# Patient Record
Sex: Male | Born: 2005 | Race: White | Hispanic: Yes | Marital: Single | Smoking: Never smoker
Health system: Southern US, Community
[De-identification: ages and names within clinical notes are randomized; demographics above are authoritative.]

## PROBLEM LIST (undated history)

## (undated) DIAGNOSIS — F840 Autistic disorder: Secondary | ICD-10-CM

## (undated) HISTORY — PX: SURGERY SCROTAL / TESTICULAR: SUR1316

---
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
## 2023-03-20 ENCOUNTER — Encounter: Payer: Self-pay | Admitting: Pediatrics

## 2023-03-20 ENCOUNTER — Ambulatory Visit (INDEPENDENT_AMBULATORY_CARE_PROVIDER_SITE_OTHER): Payer: Medicaid Other | Admitting: Pediatrics

## 2023-03-20 VITALS — BP 130/84 | Ht 64.41 in | Wt 221.2 lb

## 2023-03-20 DIAGNOSIS — Z114 Encounter for screening for human immunodeficiency virus [HIV]: Secondary | ICD-10-CM

## 2023-03-20 DIAGNOSIS — Z68.41 Body mass index (BMI) pediatric, greater than or equal to 95th percentile for age: Secondary | ICD-10-CM

## 2023-03-20 DIAGNOSIS — J4520 Mild intermittent asthma, uncomplicated: Secondary | ICD-10-CM

## 2023-03-20 DIAGNOSIS — N059 Unspecified nephritic syndrome with unspecified morphologic changes: Secondary | ICD-10-CM

## 2023-03-20 DIAGNOSIS — Z0001 Encounter for general adult medical examination with abnormal findings: Secondary | ICD-10-CM | POA: Diagnosis not present

## 2023-03-20 DIAGNOSIS — F801 Expressive language disorder: Secondary | ICD-10-CM

## 2023-03-20 DIAGNOSIS — J309 Allergic rhinitis, unspecified: Secondary | ICD-10-CM

## 2023-03-20 DIAGNOSIS — Z113 Encounter for screening for infections with a predominantly sexual mode of transmission: Secondary | ICD-10-CM

## 2023-03-20 MED ORDER — LISINOPRIL 10 MG PO TABS: 10.0000 mg | ORAL_TABLET | Freq: Every day | ORAL | 2 refills | Status: DC

## 2023-03-20 MED ORDER — LORATADINE 10 MG PO TBDP: 10.0000 mg | ORAL_TABLET | Freq: Every day | ORAL | 12 refills | Status: AC

## 2023-03-20 MED ORDER — ALBUTEROL SULFATE HFA 108 (90 BASE) MCG/ACT IN AERS: 2.0000 | INHALATION_SPRAY | RESPIRATORY_TRACT | 0 refills | Status: AC | PRN

## 2023-03-20 MED ORDER — FLUTICASONE PROPIONATE 50 MCG/ACT NA SUSP: 1.0000 | Freq: Two times a day (BID) | NASAL | 12 refills | Status: DC

## 2023-03-20 NOTE — Progress Notes (Signed)
Adolescent Well Care Visit Robert Goodman is a 17 y.o. male who is here for well care.     PCP:  Jonetta Osgood, MD   History was provided by the patient and mother.  Confidentiality was discussed with the patient and, if applicable, with caregiver as well. Patient's personal or confidential phone number:    Current Issues: Current concerns include .   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  Has guardianship in place  Nutrition: Nutrition/Eating Behaviors: no concerns Adequate calcium in diet?: yes Supplements/ Vitamins: none  Exercise/ Media: Play any Sports?:  none Exercise:  not active Screen Time:  < 2 hours Media Rules or Monitoring?: yes  Sleep:  Sleep: adequate - no loud snoring  Social Screening: Lives with:  parents, siblings Parental relations:  good  Education: School Name: [employer] - self-contained classroom  School Grade: [employer]  Patient has a dental home: yes   Confidential social history: N/A - delayed and only says minimal words  Screenings:  No screening forms done - not appopriate  PHQ-9 completed and results indicated   Physical Exam:  Vitals:   03/20/23 1524  BP: 130/84  Weight: 221 lb 3.2 oz (100.3 kg)  Height: 5' 4.41" (1.636 m)   BP 130/84   Ht 5' 4.41" (1.636 m)   Wt 221 lb 3.2 oz (100.3 kg)   BMI 37.49 kg/m  Body mass index: body mass index is 37.49 kg/m. Blood pressure %iles are not available for patients who are 18 years or older.  Hearing Screening - Comments:: UTO , tried a couple times  Vision Screening - Comments:: Mom states pt does not speak.   Physical Exam Constitutional:      Comments: Very happy and helpful  HENT:     Right Ear: Tympanic membrane normal.     Left Ear: Tympanic membrane normal.  Eyes:     General:        Right eye: No discharge.        Left eye: No discharge.     Conjunctiva/sclera: Conjunctivae normal.      Pupils: Pupils are equal, round, and reactive to light.  Cardiovascular:     Rate and Rhythm: Regular rhythm.     Heart sounds: No murmur heard. Pulmonary:     Effort: Pulmonary effort is normal.     Breath sounds: Normal breath sounds. No wheezing or rhonchi.  Abdominal:     General: Bowel sounds are normal. There is no distension.     Palpations: Abdomen is soft.  Genitourinary:    Penis: Normal.   Musculoskeletal:        General: Normal range of motion.     Cervical back: Normal range of motion.  Skin:    General: Skin is warm.     Findings: No rash.  Neurological:     Mental Status: He is alert.      Assessment and Plan:   1. Encounter for general adult medical examination with abnormal findings  2. Screening for human immunodeficiency virus Declined  3. Screening for venereal disease Declined  4. Body mass index, pediatric, greater than or equal to 95th percentile for age Encouraged physical activity - limit sweetened beverages  5. Mild intermittent chronic asthma without complication Very infrquent use, refilled albuterol to have on hand - albuterol (VENTOLIN HFA) 108 (90 Base) MCG/ACT inhaler; Inhale 2 puffs into the lungs every 4 (four) hours as  needed for wheezing or shortness of breath.  Dispense: 18 g; Refill: 0  6. Allergic rhinitis, unspecified seasonality, unspecified trigger - fluticasone (FLONASE) 50 MCG/ACT nasal spray; Place 1 spray into both nostrils 2 (two) times daily. 1 spray in each nostril every day  Dispense: 16 g; Refill: 12 Loratadine refilled  7. Severe expressive language delay EC services at school - no new needs  8. Glomerular disease Needs follow up with nephrology - family to call In the meantime lisinopril rx refilled as a bridge  Discussed acne and treatment - very mild and will not lead to scarring - discussed side effects vs benfits - declined rx at this time  BMI is not appropriate for age  Hearing screening result:not  examined Vision screening result: not examined  Counseling provided for all of the vaccine components No orders of the defined types were placed in this encounter. Vaccines up to date  PE in one year -  Discussed transition to adult care Mother prefers to wait until 19   No follow-ups on file.[name], MD

## 2023-08-02 ENCOUNTER — Telehealth: Payer: Self-pay | Admitting: Pediatrics

## 2023-08-02 NOTE — Telephone Encounter (Signed)
Robert Goodman NUMBER:  [phone]  MEDICATION(S): albuterol  PREFERRED PHARMACY: to the one on file  ARE YOU CURRENTLY COMPLETELY OUT OF THE MEDICATION? :  yes

## 2023-08-06 NOTE — Telephone Encounter (Signed)
Spoke to Nazaert's father and informed albuterol refill was called to pharmacy and will be ready today.One refill sent.

## 2024-05-06 ENCOUNTER — Ambulatory Visit: Payer: MEDICAID

## 2024-05-06 VITALS — BP 122/76 | Ht 62.84 in | Wt 224.8 lb

## 2024-05-06 DIAGNOSIS — Z1339 Encounter for screening examination for other mental health and behavioral disorders: Secondary | ICD-10-CM

## 2024-05-06 DIAGNOSIS — Z68.41 Body mass index (BMI) pediatric, greater than or equal to 95th percentile for age: Secondary | ICD-10-CM | POA: Diagnosis not present

## 2024-05-06 DIAGNOSIS — E669 Obesity, unspecified: Secondary | ICD-10-CM | POA: Diagnosis not present

## 2024-05-06 DIAGNOSIS — Z0001 Encounter for general adult medical examination with abnormal findings: Secondary | ICD-10-CM | POA: Diagnosis not present

## 2024-05-06 DIAGNOSIS — Z87441 Personal history of nephrotic syndrome: Secondary | ICD-10-CM

## 2024-05-06 DIAGNOSIS — J309 Allergic rhinitis, unspecified: Secondary | ICD-10-CM | POA: Diagnosis not present

## 2024-05-06 DIAGNOSIS — N02B9 Other recurrent and persistent immunoglobulin A nephropathy: Secondary | ICD-10-CM

## 2024-05-06 DIAGNOSIS — Z0000 Encounter for general adult medical examination without abnormal findings: Secondary | ICD-10-CM

## 2024-05-06 MED ORDER — FLUTICASONE PROPIONATE 50 MCG/ACT NA SUSP: 1.0000 | Freq: Two times a day (BID) | NASAL | 12 refills | Status: AC

## 2024-05-06 MED ORDER — CLOTRIMAZOLE 1 % EX CREA: 1.0000 | TOPICAL_CREAM | Freq: Two times a day (BID) | CUTANEOUS | 2 refills | Status: AC

## 2024-05-06 MED ORDER — LISINOPRIL 10 MG PO TABS: 10.0000 mg | ORAL_TABLET | Freq: Every day | ORAL | 12 refills | Status: AC

## 2024-05-06 MED ORDER — FEXOFENADINE HCL 180 MG PO TABS: 180.0000 mg | ORAL_TABLET | Freq: Every day | ORAL | 12 refills | Status: AC

## 2024-05-06 MED ORDER — MONTELUKAST SODIUM 10 MG PO TABS: 10.0000 mg | ORAL_TABLET | Freq: Every day | ORAL | 12 refills | Status: AC

## 2024-05-06 NOTE — Progress Notes (Signed)
 Adolescent Well Care Visit Robert Goodman is a 18 y.o. male who is here for well care.     PCP:  Delores Clapper, MD   History was provided by the mother.  Confidentiality was discussed with the patient and, if applicable, with caregiver as well.   Current Issues: Current concerns include rash on skin, otherwise doing well.    Nutrition: Nutrition/Eating Behaviors: eating well Adequate calcium in diet?: yes  Supplements/ Vitamins: no   Exercise/ Media: Play any Sports?:  none Exercise:  not active Screen Time:  < 2 hours Media Rules or Monitoring?: yes  Sleep:  Sleep: Good  Social Screening: Lives with:  Parents and siblings Parental relations:  good   Education: School Name: [employer] Grade: 10th grade  Patient has a dental home: yes   Confidential social history: Unable to complete, patient is delayed and speaks minimally  Screenings:  The patient completed the Rapid Assessment for Adolescent Preventive Services screening questionnaire and the following topics were identified as risk factors and discussed: seatbelt use  In addition, the following topics were discussed as part of anticipatory guidance healthy eating, exercise, and seatbelt use.  PHQ-9 unable to complete  Physical Exam:  Vitals:   05/06/24 1431  BP: 122/76  Weight: 224 lb 12.8 oz (102 kg)  Height: 5' 2.84 (1.596 m)   BP 122/76 (BP Location: Left Arm, Patient Position: Sitting, Cuff Size: Normal)   Ht 5' 2.84 (1.596 m)   Wt 224 lb 12.8 oz (102 kg)   BMI 40.03 kg/m  Body mass index: body mass index is 40.03 kg/m. Growth %ile SmartLinks can only be used for patients less than 87 years old.  Hearing Screening (Inadequate exam)    Right ear  Left ear   Vision Screening (Inadequate exam)    Physical Exam Constitutional:      Appearance: He is obese.  HENT:     Right Ear: Tympanic membrane normal.     Left Ear: Tympanic membrane normal.     Nose: Nose normal.      Mouth/Throat:     Mouth: Mucous membranes are moist.  Eyes:     Extraocular Movements: Extraocular movements intact.     Pupils: Pupils are equal, round, and reactive to light.  Cardiovascular:     Rate and Rhythm: Normal rate and regular rhythm.  Pulmonary:     Effort: Pulmonary effort is normal.     Breath sounds: Normal breath sounds.  Abdominal:     General: Bowel sounds are normal.  Musculoskeletal:        General: Normal range of motion.     Cervical back: Normal range of motion.  Skin:    General: Skin is warm.     Comments: Annular hyperpigmented rash in right skin fold under breast tissue  Neurological:     Mental Status: Mental status is at baseline.      Assessment and Plan:   Encounter for general adult medical examination with abnormal findings   Body mass index, pediatric, greater than or equal to 95th percentile for age- Encouraged physical activity and balanced diet  Annular rash consistent with tinea corporis- Prescribed clotrimazole  to apply BID  History of IgA nephropathy- Continue lisinopril  10 mg daily, refilled today. BP stable today. Has been followed by nephrology in the past, now needs to establish care with adult nephrology. Referral placed  Severe expressive language delay- EC services in school, no new needs  Allergic rhinitis- Refilled fluticasone , allegra , and  montelukast     Hearing screening result:not examined Vision screening result: not examined  Counseling provided for all of the vaccine components  Orders Placed This Encounter  Procedures   Ambulatory referral to Nephrology     Return in about 1 year (around 05/06/2025) for [facility].Robert Olen Hamilton, MD

## 2024-08-08 NOTE — Progress Notes (Signed)
 PCP: Delores Clapper, MD   Chief Complaint  Patient presents with   Joint Swelling    [name] in the shower, now is bruised     Subjective:  HPI:  Robert Goodman is a 18 y.o. male here for swollen ankle   Interpreter present: yes - virtual [address] [place], Spanish, name/ID: Robert Goodman   Chart review: - severe expressive language delay, intellectual disability, non-verbal - guardianship in place - allergic rhinitis - fluticasone , allegra , montelukast  - C3 glomerulonephropathy nephropathy - on lisinopril  10 mg daily, prev followed by Nephrology (last seen June 2023) -- referral to adult nephro placed at well visit in Aug 2025  - asthma  - Vit D deficiency   New HPI: - Fell in shower about 2 weeks ago.  He remained in the shower when he fell, but hit the faucet.  No loss of consciousness. - About 2 days later, mom noticed he had bruising over his right lower leg and a very small bruise over his lower right belly - In the last couple days, mom feels like the bruising over the right lower leg is spreading.  No heat or localized tenderness.  The area is also starting to have flaky skin.  - He did not cry after the fall, but said that he was hurting.  He was able to ambulate right away.  This week, he also says that his leg hurts, but it does not seem to be an increase in the pain he complained about before.  - He is not taking anything for the pain.  He has continued to go to school. - No limping over the last 2 weeks - No new injuries over the last 2 weeks - No other rash or symptoms - No swelling in other places, including hands, feet, face, or genitalia  Meds: Current Outpatient Medications  Medication Sig Dispense Refill   acetaminophen  (TYLENOL ) 325 MG tablet Take 2 tablets (650 mg total) by mouth every 6 (six) hours as needed for mild pain or fever. (Patient not taking: Reported on 05/06/2024) 30 tablet 0   albuterol  (VENTOLIN  HFA) 108 (90 Base) MCG/ACT inhaler Inhale 2 puffs  into the lungs every 4 (four) hours as needed for wheezing or shortness of breath. (Patient not taking: Reported on 05/06/2024) 18 g 0   clotrimazole  (LOTRIMIN ) 1 % cream Apply 1 Application topically 2 (two) times daily. 30 g 2   ferrous sulfate  325 (65 FE) MG EC tablet Take 1 tablet (325 mg total) by mouth daily with breakfast. (Patient not taking: Reported on 05/06/2024) 30 tablet 11   fexofenadine  (ALLEGRA ) 180 MG tablet Take 1 tablet (180 mg total) by mouth daily. 30 tablet 12   fluticasone  (FLONASE ) 50 MCG/ACT nasal spray Place 1 spray into both nostrils 2 (two) times daily. 1 spray in each nostril every day 16 g 12   lisinopril  (ZESTRIL ) 10 MG tablet Take 1 tablet (10 mg total) by mouth daily. 30 tablet 12   loratadine  (CLARITIN  REDITABS) 10 MG dissolvable tablet Take 1 tablet (10 mg total) by mouth daily. As needed for allergy symptoms (Patient not taking: Reported on 05/06/2024) 31 tablet 12   montelukast  (SINGULAIR ) 10 MG tablet Take 1 tablet (10 mg total) by mouth at bedtime. 30 tablet 12   Vitamin D , Ergocalciferol , (DRISDOL ) 1.25 MG (50000 UT) CAPS capsule Take 1 capsule (50,000 Units total) by mouth every 7 (seven) days. (Patient not taking: Reported on 05/06/2024) 12 capsule 3   No current facility-administered medications for this  visit.    ALLERGIES: No Known Allergies  PMH:  Past Medical History:  Diagnosis Date   Asthma    Autism     PSH:  Past Surgical History:  Procedure Laterality Date   RADIOLOGY WITH ANESTHESIA N/A 03/31/2013   Procedure: RADIOLOGY WITH ANESTHESIA;  Surgeon: Medication Radiologist, MD;  Location: [facility] OR;  Service: Radiology;  Laterality: N/A;   SURGERY SCROTAL / TESTICULAR     TONSILLECTOMY AND ADENOIDECTOMY  2008    Social history:  Social History   Social History Narrative   Is in 5th at [employer] with parents, 2 sisters, 2 brothers    Family history: Family History  Problem Relation Age of Onset   Cancer Maternal  Grandmother    Alcohol abuse Paternal Uncle      Objective:   Physical Examination:  Temp:   Pulse:   BP:   (Growth %ile SmartLinks can only be used for patients less than 30 years old.)  Wt: 227 lb 12.8 oz (103.3 kg)  Ht:    BMI: Body mass index is 40.57 kg/m. (Facility age limit for growth %iles is 20 years from contact on 05/06/2024.) GENERAL: Well appearing, MMM LUNGS: Comfortable work of breathing CARDIO: warm, well perfused  MSK: - Mild lower right leg swelling (32.5 cm circumference) compared to left leg (32.0 cm circumference) - Bruising over right shin (~6 cm) with overlying flaking -- diffuse dryness over both lower legs  - Dorsal pedal pulses are intact.  Sensation grossly intact throughout the foot -- laughs when feet tickled - Patient is able to wiggle toes and extend and flex the right toes  - Patient everts and inverts the right foot.   - Good dorsiflexion and plantarflexion bilaterally - There are no rashes or ulcerations over the foot.   - Achilles tendon is intact and nontender to palpation.  - Patient is nontender over the medial and lateral malleoli.  Patient is nontender over the hindfoot.  Patient is nontender over the second, third, and fourth metatarsals.  No tenderness over the base of the 5th metatarsal.  There is no gross deformity of the left foot compared to the right. - Normal gait in hallway  - Hops on and off exam table without issue  NEURO: Awake, alert, interactive SKIN:  - Small ~ 2 x 2.5 cm area of superficial ecchymosis over lower abdomen, nontender      Assessment/Plan:   Robert Goodman is a 18 y.o. old male here with right lower leg bruising and mild swelling (just above the right ankle) following a fall in the shower about 2 weeks ago.  There is also a small flat area of ecchymoses over his lower abdomen.    I suspect that the increased bruising this week is part of the natural progression of healing.  Less concern for cellulitis (no significant  erythema, no heat, no excoriations or other trauma as a nidus), DVT (no significant swelling today, no heat, tenderness to touch).  I suspect he may have sustained a ligamentous injury.  I am reassured that his gait is normal and the pain has not increased (though his verbal skills are limited).  Parents feel like his behavior is at baseline.  - Discussed resting and elevating the leg today since he is not at school - OK to apply ice to the area if this feels good -- 15 minutes about three times per day  - Provided address to [facility] -- which is  open today and tomorrow.  Advise walk-in visit with their office if pain or swelling is worsening over the weekend.  Explained that they could obtain an XR.  - Provided letter for school to allow for extra time to transition between classes and permission to use the elevator to go up and down levels at school while he is recovering.  EPIC down during visit today-letter was handwritten and provided to family - Reviewed strict return precautions for ED care over the weekend, including any increased redness, swelling, pain or inability to ambulate  Follow up: Return if symptoms worsen or fail to improve.   Florina Mail, MD  [facility]  Time spent reviewing chart in preparation for visit:  2 minutes Time spent face-to-face with patient: 25 minutes - history, exam, emergency care instructions, interpretation required Time spent not face-to-face with patient for documentation and care coordination on date of service: 5 minutes -documentation + letter for school

## 2024-08-09 ENCOUNTER — Ambulatory Visit: Payer: MEDICAID | Admitting: Pediatrics

## 2024-08-09 VITALS — Wt 227.8 lb

## 2024-08-09 DIAGNOSIS — Y92009 Unspecified place in unspecified non-institutional (private) residence as the place of occurrence of the external cause: Secondary | ICD-10-CM

## 2024-08-09 DIAGNOSIS — T148XXA Other injury of unspecified body region, initial encounter: Secondary | ICD-10-CM | POA: Diagnosis not present

## 2024-08-09 DIAGNOSIS — M7989 Other specified soft tissue disorders: Secondary | ICD-10-CM

## 2024-08-09 DIAGNOSIS — W19XXXA Unspecified fall, initial encounter: Secondary | ICD-10-CM | POA: Diagnosis not present

## 2024-08-09 NOTE — Patient Instructions (Signed)
 [facility]  [address] [place], [place] [address]  [phone]  Saturday: 9:00 am-2:00 pm Sunday: 10:00 am - 2:00 pm   Monday to Friday: 5:30 to 9:00 pm
